# Patient Record
Sex: Female | Born: 1985 | Hispanic: No | Marital: Married | Smoking: Never smoker
Health system: Southern US, Community
[De-identification: ages and names within clinical notes are randomized; demographics above are authoritative.]

## PROBLEM LIST (undated history)

## (undated) ENCOUNTER — Inpatient Hospital Stay (HOSPITAL_COMMUNITY): Payer: Self-pay

---

## 2015-09-25 ENCOUNTER — Other Ambulatory Visit (HOSPITAL_COMMUNITY): Payer: Self-pay | Admitting: Obstetrics and Gynecology

## 2015-09-25 DIAGNOSIS — Z3169 Encounter for other general counseling and advice on procreation: Secondary | ICD-10-CM

## 2015-10-01 ENCOUNTER — Ambulatory Visit (HOSPITAL_COMMUNITY)
Admission: RE | Admit: 2015-10-01 | Discharge: 2015-10-01 | Disposition: A | Discharge status: Home / Self Care | Payer: 59 | Source: Ambulatory Visit | Attending: Obstetrics and Gynecology | Admitting: Obstetrics and Gynecology

## 2015-10-01 ENCOUNTER — Encounter (INDEPENDENT_AMBULATORY_CARE_PROVIDER_SITE_OTHER): Payer: Self-pay

## 2015-10-01 ENCOUNTER — Encounter (HOSPITAL_COMMUNITY): Payer: Self-pay

## 2015-10-01 DIAGNOSIS — N979 Female infertility, unspecified: Secondary | ICD-10-CM | POA: Diagnosis not present

## 2015-10-01 DIAGNOSIS — Z3169 Encounter for other general counseling and advice on procreation: Secondary | ICD-10-CM

## 2015-10-01 MED ORDER — IOPAMIDOL (ISOVUE-300) INJECTION 61%
30.0000 mL | Freq: Once | INTRAVENOUS | Status: AC | PRN
  Administered 2015-10-01: 30 mL

## 2016-01-01 LAB — OB RESULTS CONSOLE GC/CHLAMYDIA
Chlamydia: NEGATIVE
Gonorrhea: NEGATIVE

## 2016-01-01 LAB — OB RESULTS CONSOLE HEPATITIS B SURFACE ANTIGEN: HEP B S AG: NEGATIVE

## 2016-01-01 LAB — OB RESULTS CONSOLE RPR: RPR: NONREACTIVE

## 2016-01-01 LAB — OB RESULTS CONSOLE HIV ANTIBODY (ROUTINE TESTING): HIV: NONREACTIVE

## 2016-01-01 LAB — OB RESULTS CONSOLE RUBELLA ANTIBODY, IGM: Rubella: IMMUNE

## 2016-05-20 ENCOUNTER — Encounter: Payer: 59 | Attending: Obstetrics and Gynecology | Admitting: Registered"

## 2016-05-20 DIAGNOSIS — R7309 Other abnormal glucose: Secondary | ICD-10-CM

## 2016-05-20 DIAGNOSIS — Z713 Dietary counseling and surveillance: Secondary | ICD-10-CM | POA: Diagnosis not present

## 2016-05-20 DIAGNOSIS — O99810 Abnormal glucose complicating pregnancy: Secondary | ICD-10-CM | POA: Insufficient documentation

## 2016-05-20 DIAGNOSIS — Z3A00 Weeks of gestation of pregnancy not specified: Secondary | ICD-10-CM | POA: Diagnosis not present

## 2016-05-20 DIAGNOSIS — Z6823 Body mass index (BMI) 23.0-23.9, adult: Secondary | ICD-10-CM | POA: Insufficient documentation

## 2016-05-21 ENCOUNTER — Encounter: Payer: Self-pay | Admitting: Registered"

## 2016-05-21 NOTE — Progress Notes (Signed)
Patient was seen on 05/20/2016 for Gestational Diabetes self-management . The following learning objectives were met by the patient :   States the definition of Gestational Diabetes  States why dietary management is important in controlling blood glucose  Describes the effects of carbohydrates on blood glucose levels  Demonstrates ability to create a balanced meal plan  Demonstrates carbohydrate counting   States when to check blood glucose levels  Demonstrates proper blood glucose monitoring techniques  States the effect of stress and exercise on blood glucose levels  States the importance of limiting caffeine and abstaining from alcohol and smoking  Plan:  Aim for 3 Carb Choices per meal (45 grams) +/- 1 either way  Aim for 1-2 Carbs per snack Begin reading food labels for Total Carbohydrate of foods Consider  increasing your activity level by walking or other activity daily as tolerated Begin checking BG before breakfast and 2 hours after first bite of breakfast, lunch and dinner as directed by MD  Bring Log Book to every medical appointment   Take medication if directed by MD  Blood glucose monitor given: One Touch Verio Lot # [id number] X Exp: 2016-09-25 Blood glucose reading: 75 mg/dl  Patient instructed to monitor glucose levels: FBS: 60 - <90 2 hour: <120 1 hour: <140 ([place] office)  Patient received the following handouts:  Nutrition Diabetes and Pregnancy  Carbohydrate Counting List  Patient will be seen for follow-up as needed.

## 2016-07-02 LAB — OB RESULTS CONSOLE GBS: GBS: POSITIVE

## 2016-07-20 ENCOUNTER — Encounter (HOSPITAL_COMMUNITY): Payer: Self-pay | Admitting: *Deleted

## 2016-07-20 ENCOUNTER — Inpatient Hospital Stay (HOSPITAL_COMMUNITY)
Admission: AD | Admit: 2016-07-20 | Discharge: 2016-07-24 | DRG: 765 | Disposition: A | Discharge status: Home / Self Care | Payer: 59 | Source: Ambulatory Visit | Attending: Obstetrics and Gynecology | Admitting: Obstetrics and Gynecology

## 2016-07-20 ENCOUNTER — Inpatient Hospital Stay (HOSPITAL_COMMUNITY)
Admission: AD | Admit: 2016-07-20 | Discharge: 2016-07-20 | Disposition: A | Discharge status: Home / Self Care | Payer: 59 | Source: Ambulatory Visit | Attending: Obstetrics and Gynecology | Admitting: Obstetrics and Gynecology

## 2016-07-20 ENCOUNTER — Inpatient Hospital Stay (HOSPITAL_COMMUNITY): Payer: 59 | Admitting: Anesthesiology

## 2016-07-20 ENCOUNTER — Encounter (HOSPITAL_COMMUNITY): Payer: Self-pay

## 2016-07-20 DIAGNOSIS — Z3493 Encounter for supervision of normal pregnancy, unspecified, third trimester: Secondary | ICD-10-CM | POA: Diagnosis present

## 2016-07-20 DIAGNOSIS — O9081 Anemia of the puerperium: Secondary | ICD-10-CM | POA: Diagnosis not present

## 2016-07-20 DIAGNOSIS — O324XX0 Maternal care for high head at term, not applicable or unspecified: Secondary | ICD-10-CM | POA: Diagnosis present

## 2016-07-20 DIAGNOSIS — Z98891 History of uterine scar from previous surgery: Secondary | ICD-10-CM

## 2016-07-20 DIAGNOSIS — O99824 Streptococcus B carrier state complicating childbirth: Secondary | ICD-10-CM | POA: Diagnosis present

## 2016-07-20 DIAGNOSIS — O471 False labor at or after 37 completed weeks of gestation: Secondary | ICD-10-CM | POA: Insufficient documentation

## 2016-07-20 DIAGNOSIS — Z3A00 Weeks of gestation of pregnancy not specified: Secondary | ICD-10-CM

## 2016-07-20 DIAGNOSIS — O24410 Gestational diabetes mellitus in pregnancy, diet controlled: Secondary | ICD-10-CM | POA: Diagnosis present

## 2016-07-20 DIAGNOSIS — Z3A38 38 weeks gestation of pregnancy: Secondary | ICD-10-CM | POA: Diagnosis not present

## 2016-07-20 DIAGNOSIS — D62 Acute posthemorrhagic anemia: Secondary | ICD-10-CM | POA: Diagnosis not present

## 2016-07-20 LAB — GLUCOSE, CAPILLARY
GLUCOSE-CAPILLARY: 133 mg/dL — AB (ref 65–99)
Glucose-Capillary: 107 mg/dL — ABNORMAL HIGH (ref 65–99)
Glucose-Capillary: 125 mg/dL — ABNORMAL HIGH (ref 65–99)
Glucose-Capillary: 86 mg/dL (ref 65–99)

## 2016-07-20 LAB — CBC
HEMATOCRIT: 37.9 % (ref 36.0–46.0)
HEMOGLOBIN: 13.3 g/dL (ref 12.0–15.0)
MCH: 32.7 pg (ref 26.0–34.0)
MCHC: 35.1 g/dL (ref 30.0–36.0)
MCV: 93.1 fL (ref 78.0–100.0)
Platelets: 191 10*3/uL (ref 150–400)
RBC: 4.07 MIL/uL (ref 3.87–5.11)
RDW: 13.1 % (ref 11.5–15.5)
WBC: 11.4 10*3/uL — ABNORMAL HIGH (ref 4.0–10.5)

## 2016-07-20 LAB — ABO/RH: ABO/RH(D): B POS

## 2016-07-20 LAB — TYPE AND SCREEN
ABO/RH(D): B POS
Antibody Screen: NEGATIVE

## 2016-07-20 MED ORDER — PHENYLEPHRINE 40 MCG/ML (10ML) SYRINGE FOR IV PUSH (FOR BLOOD PRESSURE SUPPORT): 80.0000 ug | PREFILLED_SYRINGE | INTRAVENOUS | Status: DC | PRN

## 2016-07-20 MED ORDER — LIDOCAINE HCL (PF) 1 % IJ SOLN
INTRAMUSCULAR | Status: DC | PRN
  Administered 2016-07-20 (×2): 6 mL via EPIDURAL

## 2016-07-20 MED ORDER — SOD CITRATE-CITRIC ACID 500-334 MG/5ML PO SOLN
30.0000 mL | ORAL | Status: DC | PRN
Start: 2016-07-20 — End: 2016-07-21
  Administered 2016-07-20: 30 mL via ORAL
  Filled 2016-07-20: qty 15

## 2016-07-20 MED ORDER — EPHEDRINE 5 MG/ML INJ: 10.0000 mg | INTRAVENOUS | Status: DC | PRN

## 2016-07-20 MED ORDER — DIPHENHYDRAMINE HCL 50 MG/ML IJ SOLN: 12.5000 mg | INTRAMUSCULAR | Status: DC | PRN

## 2016-07-20 MED ORDER — OXYTOCIN 40 UNITS IN LACTATED RINGERS INFUSION - SIMPLE MED
2.5000 [IU]/h | INTRAVENOUS | Status: DC
  Filled 2016-07-20: qty 1000

## 2016-07-20 MED ORDER — PENICILLIN G POTASSIUM 5000000 UNITS IJ SOLR
5.0000 10*6.[IU] | Freq: Once | INTRAMUSCULAR | Status: AC
  Administered 2016-07-20: 5 10*6.[IU] via INTRAVENOUS
  Filled 2016-07-20: qty 5

## 2016-07-20 MED ORDER — TERBUTALINE SULFATE 1 MG/ML IJ SOLN: 0.2500 mg | Freq: Once | INTRAMUSCULAR | Status: DC | PRN

## 2016-07-20 MED ORDER — OXYCODONE-ACETAMINOPHEN 5-325 MG PO TABS: 1.0000 | ORAL_TABLET | ORAL | Status: DC | PRN

## 2016-07-20 MED ORDER — OXYTOCIN 40 UNITS IN LACTATED RINGERS INFUSION - SIMPLE MED
1.0000 m[IU]/min | INTRAVENOUS | Status: DC
  Administered 2016-07-20: 2 m[IU]/min via INTRAVENOUS

## 2016-07-20 MED ORDER — LACTATED RINGERS IV SOLN
INTRAVENOUS | Status: DC
  Administered 2016-07-20: 125 mL via INTRAVENOUS

## 2016-07-20 MED ORDER — LACTATED RINGERS IV SOLN
500.0000 mL | Freq: Once | INTRAVENOUS | Status: AC
  Administered 2016-07-20: 500 mL via INTRAVENOUS

## 2016-07-20 MED ORDER — LIDOCAINE HCL (PF) 1 % IJ SOLN
30.0000 mL | INTRAMUSCULAR | Status: DC | PRN
  Filled 2016-07-20: qty 30

## 2016-07-20 MED ORDER — PHENYLEPHRINE 40 MCG/ML (10ML) SYRINGE FOR IV PUSH (FOR BLOOD PRESSURE SUPPORT)
80.0000 ug | PREFILLED_SYRINGE | INTRAVENOUS | Status: DC | PRN
  Filled 2016-07-20: qty 10

## 2016-07-20 MED ORDER — LACTATED RINGERS IV SOLN: 500.0000 mL | INTRAVENOUS | Status: DC | PRN

## 2016-07-20 MED ORDER — FLEET ENEMA 7-19 GM/118ML RE ENEM: 1.0000 | ENEMA | RECTAL | Status: DC | PRN

## 2016-07-20 MED ORDER — OXYCODONE-ACETAMINOPHEN 5-325 MG PO TABS: 2.0000 | ORAL_TABLET | ORAL | Status: DC | PRN

## 2016-07-20 MED ORDER — OXYTOCIN BOLUS FROM INFUSION: 500.0000 mL | Freq: Once | INTRAVENOUS | Status: DC

## 2016-07-20 MED ORDER — PENICILLIN G POT IN DEXTROSE 60000 UNIT/ML IV SOLN
3.0000 10*6.[IU] | INTRAVENOUS | Status: DC
  Administered 2016-07-20 (×2): 3 10*6.[IU] via INTRAVENOUS
  Filled 2016-07-20 (×6): qty 50

## 2016-07-20 MED ORDER — ONDANSETRON HCL 4 MG/2ML IJ SOLN: 4.0000 mg | Freq: Four times a day (QID) | INTRAMUSCULAR | Status: DC | PRN

## 2016-07-20 MED ORDER — MORPHINE SULFATE (PF) 0.5 MG/ML IJ SOLN
INTRAMUSCULAR | Status: AC
  Filled 2016-07-20: qty 10

## 2016-07-20 MED ORDER — ACETAMINOPHEN 325 MG PO TABS: 650.0000 mg | ORAL_TABLET | ORAL | Status: DC | PRN

## 2016-07-20 MED ORDER — FENTANYL 2.5 MCG/ML BUPIVACAINE 1/10 % EPIDURAL INFUSION (WH - ANES)
14.0000 mL/h | INTRAMUSCULAR | Status: DC | PRN
  Administered 2016-07-20: 12 mL/h via EPIDURAL
  Administered 2016-07-20 (×2): 14 mL/h via EPIDURAL
  Filled 2016-07-20 (×2): qty 100

## 2016-07-20 MED ORDER — LACTATED RINGERS IV SOLN: 500.0000 mL | Freq: Once | INTRAVENOUS | Status: DC

## 2016-07-20 NOTE — Progress Notes (Signed)
Called  By RN due to pushing for over 2 1/2 hrs and vtx not moving  O: VE fully (+2) caput With bony prominence -1  Tracing: baseline 125 (+) variables with ctx  IMP: arrest of descent Class A1 GDM  P) recommend C/S. Not a candidate for vacuum assistance. Pt and husband advised. Risk of  Surgery reviewed including infection, bleeding, poss blood transfusion and its risk, injury to surrounding organs structures,  Internal scar tissue . All ? Answered OR notiified

## 2016-07-20 NOTE — MAU Note (Signed)
Pt presents to MAU c/o ctx that are every 5-288min. Pt states yesterday she noticed a brown discharge. Pt states the ctxs started around 0230 and they were every 30 min then around 0330 they were every 5-448min. Pt states no LOF. Pt said she had not noticed baby moving because of her pain.

## 2016-07-20 NOTE — Anesthesia Preprocedure Evaluation (Addendum)
Anesthesia Evaluation  Patient identified by MRN, date of birth, ID band Patient awake    Reviewed: Allergy & Precautions, NPO status , Patient's Chart, lab work & pertinent test results  History of Anesthesia Complications Negative for: history of anesthetic complications  Airway Mallampati: II  TM Distance: >3 FB Neck ROM: Full    Dental  (+) Dental Advisory Given   Pulmonary neg pulmonary ROS,    breath sounds clear to auscultation       Cardiovascular negative cardio ROS   Rhythm:Regular Rate:Normal     Neuro/Psych negative neurological ROS     GI/Hepatic negative GI ROS, Neg liver ROS,   Endo/Other  diabetes (glu 125), Gestational  Renal/GU negative Renal ROS     Musculoskeletal   Abdominal   Peds  Hematology plt 191k   Anesthesia Other Findings   Reproductive/Obstetrics (+) Pregnancy                            Anesthesia Physical Anesthesia Plan  ASA: II  Anesthesia Plan: Epidural   Post-op Pain Management:    Induction:   PONV Risk Score and Plan:   Airway Management Planned: Natural Airway  Additional Equipment:   Intra-op Plan:   Post-operative Plan:   Informed Consent: I have reviewed the patients History and Physical, chart, labs and discussed the procedure including the risks, benefits and alternatives for the proposed anesthesia with the patient or authorized representative who has indicated his/her understanding and acceptance.   Dental advisory given  Plan Discussed with:   Anesthesia Plan Comments: (Patient identified. Risks/Benefits/Options discussed with patient including but not limited to bleeding, infection, nerve damage, paralysis, failed block, incomplete pain control, headache, blood pressure changes, nausea, vomiting, reactions to medication both or allergic, itching and postpartum back pain. Confirmed with bedside nurse the patient's most recent  platelet count. Confirmed with patient that they are not currently taking any anticoagulation, have any bleeding history or any family history of bleeding disorders. Patient expressed understanding and wished to proceed. All questions were answered. )        Anesthesia Quick Evaluation

## 2016-07-20 NOTE — MAU Note (Signed)
S; comfortable SROM clear fluid  O:  Pitocin VE per RN: 7/80/-1 Tracing: baseline 140 (+) accel Ctx q 2-4 mins  CBG: 133 IMP: Active phase Term Class A1 GDM  P) cont present mgmt. Change to carb modified diet. Exaggerated sims

## 2016-07-20 NOTE — Discharge Instructions (Signed)

## 2016-07-20 NOTE — Anesthesia Pain Management Evaluation Note (Signed)
  CRNA Pain Management Visit Note  Patient: Jacqueline Little, 31 y.o., female  "Hello I am a member of the anesthesia team at [facility]. We have an anesthesia team available at all times to provide care throughout the hospital, including epidural management and anesthesia for C-section. I don't know your plan for the delivery whether it a natural birth, water birth, IV sedation, nitrous supplementation, doula or epidural, but we want to meet your pain goals."   1.Was your pain managed to your expectations on prior hospitalizations?   No prior hospitalizations  2.What is your expectation for pain management during this hospitalization?     Epidural  3.How can we help you reach that goal?   Record the patient's initial score and the patient's pain goal.   Pain: 0  Pain Goal: 5 [facility] wants you to be able to say your pain was always managed very well.  Laban EmperorMalinova,Kieran Nachtigal Hristova 07/20/2016

## 2016-07-20 NOTE — MAU Note (Signed)
S; c/o rectal pressure  O: pitocin  VE 10/100/+1  Tracing: baseline 140 (+) early decel Ctx q  IMP: complete Class a1 GDM GBS cx (+) P) start pushing

## 2016-07-20 NOTE — Anesthesia Procedure Notes (Signed)
Epidural Patient location during procedure: OB Start time: 07/20/2016 12:50 PM End time: 07/20/2016 1:13 PM  Staffing Anesthesiologist: Jairo BenJACKSON, Julianah Marciel Performed: anesthesiologist   Preanesthetic Checklist Completed: patient identified, surgical consent, pre-op evaluation, timeout performed, IV checked, risks and benefits discussed and monitors and equipment checked  Epidural Patient position: sitting Prep: site prepped and draped and DuraPrep Patient monitoring: blood pressure, continuous pulse ox and heart rate Approach: midline Location: L3-L4 Injection technique: LOR air  Needle:  Needle type: Tuohy  Needle gauge: 17 G Needle length: 9 cm Needle insertion depth: 3.5 cm Catheter type: closed end flexible Catheter size: 19 Gauge Catheter at skin depth: 8 cm Test dose: negative (1% lidocaine)  Assessment Events: blood not aspirated, injection not painful, no injection resistance, negative IV test and no paresthesia  Additional Notes Pt identified in Labor room.  Monitors applied. Working IV access confirmed. Sterile prep, drape lumbar spine.  1% lido local L 3,4.  #17ga Touhy LOR air at 3.5 cm L 3,4, cath in easily to 8 cm skin. Test dose OK, cath dosed and infusion begun.  Patient asymptomatic, VSS, no heme aspirated, tolerated well.  Jacqueline Little, MDReason for block:procedure for pain

## 2016-07-20 NOTE — MAU Note (Signed)
Pt sent home a few hours ago. Was 2cm. Ctx more uncomfortable  reports some bloody show. Good fetal movement felt

## 2016-07-20 NOTE — H&P (Signed)
Jacqueline Little is a 31 y.o. female presenting at term gestation in labor. Intact membrane. PNC complicated by  OB History    Gravida Para Term Preterm AB Living   1 0 0 0 0 0   SAB TAB Ectopic Multiple Live Births   0 0 0 0 0     History reviewed. No pertinent past medical history. History reviewed. No pertinent surgical history. Family History: family history includes Cancer in her mother; Diabetes in her paternal grandmother; Hypertension in her paternal grandmother. Social History:  reports that she has never smoked. She has never used smokeless tobacco. She reports that she does not drink alcohol or use drugs.     Maternal Diabetes: Yes:  Diabetes Type:  Diet controlled Genetic Screening: Normal Maternal Ultrasounds/Referrals: Normal Fetal Ultrasounds or other Referrals:  None Maternal Substance Abuse:  No Significant Maternal Medications:  None Significant Maternal Lab Results:  Lab values include: Group B Strep positive Other Comments:  None  Review of Systems  All other systems reviewed and are negative.  History Dilation: 4 Effacement (%): 100 Station: -1 Exam by:: K.Wilson,RN Blood pressure 107/64, pulse 61, temperature 97.8 F (36.6 C), temperature source Oral, resp. rate 20, last menstrual period 09/22/2015. Exam Physical Exam  Constitutional: She is oriented to person, place, and time. She appears well-developed and well-nourished.  HENT:  Head: Atraumatic.  Eyes: EOM are normal.  Neck: Neck supple.  Cardiovascular: Regular rhythm.   Respiratory: Breath sounds normal.  GI: Soft.  Musculoskeletal: She exhibits edema.  Neurological: She is alert and oriented to person, place, and time.  Skin: Skin is warm and dry.  Psychiatric: She has a normal mood and affect.    Prenatal labs: ABO, Rh: --/--/B POS (06/25 1209) Antibody: NEG (06/25 1209) Rubella:  Immune RPR:   NR HBsAg:   neg HIV:   neg GBS: Positive (06/07 0000)   Assessment/Plan: Early  labor Class A1 GDM Term gestation P) admit routine labs. IV Pitocin. Epidural. Defer amniotomy. IV PCN   Sokha Craker A 07/20/2016, 1:23 PM

## 2016-07-21 ENCOUNTER — Encounter (HOSPITAL_COMMUNITY)
Admission: AD | Disposition: A | Discharge status: Home / Self Care | Payer: Self-pay | Source: Ambulatory Visit | Attending: Obstetrics and Gynecology

## 2016-07-21 ENCOUNTER — Encounter (HOSPITAL_COMMUNITY): Payer: Self-pay

## 2016-07-21 DIAGNOSIS — Z98891 History of uterine scar from previous surgery: Secondary | ICD-10-CM

## 2016-07-21 LAB — CBC
HEMATOCRIT: 25.1 % — AB (ref 36.0–46.0)
Hemoglobin: 8.8 g/dL — ABNORMAL LOW (ref 12.0–15.0)
MCH: 33.3 pg (ref 26.0–34.0)
MCHC: 35.1 g/dL (ref 30.0–36.0)
MCV: 95.1 fL (ref 78.0–100.0)
PLATELETS: 156 10*3/uL (ref 150–400)
RBC: 2.64 MIL/uL — ABNORMAL LOW (ref 3.87–5.11)
RDW: 13.3 % (ref 11.5–15.5)
WBC: 11.9 10*3/uL — AB (ref 4.0–10.5)

## 2016-07-21 LAB — RPR: RPR Ser Ql: NONREACTIVE

## 2016-07-21 LAB — GLUCOSE, CAPILLARY: GLUCOSE-CAPILLARY: 137 mg/dL — AB (ref 65–99)

## 2016-07-21 SURGERY — Surgical Case
Anesthesia: Epidural

## 2016-07-21 MED ORDER — SODIUM CHLORIDE 0.9% FLUSH
3.0000 mL | INTRAVENOUS | Status: DC | PRN
Start: 2016-07-21 — End: 2016-07-23

## 2016-07-21 MED ORDER — DIPHENHYDRAMINE HCL 50 MG/ML IJ SOLN
INTRAMUSCULAR | Status: AC
  Filled 2016-07-21: qty 1

## 2016-07-21 MED ORDER — ZOLPIDEM TARTRATE 5 MG PO TABS: 5.0000 mg | ORAL_TABLET | Freq: Every evening | ORAL | Status: DC | PRN

## 2016-07-21 MED ORDER — SCOPOLAMINE 1 MG/3DAYS TD PT72
MEDICATED_PATCH | TRANSDERMAL | Status: AC
  Filled 2016-07-21: qty 1

## 2016-07-21 MED ORDER — HYDROMORPHONE HCL 1 MG/ML IJ SOLN
INTRAMUSCULAR | Status: AC
  Filled 2016-07-21: qty 0.5

## 2016-07-21 MED ORDER — DIBUCAINE 1 % RE OINT: 1.0000 "application " | TOPICAL_OINTMENT | RECTAL | Status: DC | PRN

## 2016-07-21 MED ORDER — OXYTOCIN 40 UNITS IN LACTATED RINGERS INFUSION - SIMPLE MED: 2.5000 [IU]/h | INTRAVENOUS | Status: AC

## 2016-07-21 MED ORDER — KETOROLAC TROMETHAMINE 30 MG/ML IJ SOLN: 30.0000 mg | Freq: Four times a day (QID) | INTRAMUSCULAR | Status: AC | PRN

## 2016-07-21 MED ORDER — NALBUPHINE HCL 10 MG/ML IJ SOLN: 5.0000 mg | Freq: Once | INTRAMUSCULAR | Status: DC | PRN

## 2016-07-21 MED ORDER — OXYCODONE-ACETAMINOPHEN 5-325 MG PO TABS: 1.0000 | ORAL_TABLET | ORAL | Status: DC | PRN

## 2016-07-21 MED ORDER — METOCLOPRAMIDE HCL 5 MG/ML IJ SOLN
INTRAMUSCULAR | Status: AC
  Filled 2016-07-21: qty 2

## 2016-07-21 MED ORDER — PROMETHAZINE HCL 25 MG/ML IJ SOLN: 6.2500 mg | INTRAMUSCULAR | Status: DC | PRN

## 2016-07-21 MED ORDER — LACTATED RINGERS IV SOLN
INTRAVENOUS | Status: DC | PRN
  Administered 2016-07-21 (×2): via INTRAVENOUS

## 2016-07-21 MED ORDER — PHENYLEPHRINE HCL 10 MG/ML IJ SOLN
INTRAMUSCULAR | Status: DC | PRN
  Administered 2016-07-21 (×5): 80 ug via INTRAVENOUS
  Administered 2016-07-21: 40 ug via INTRAVENOUS
  Administered 2016-07-21 (×2): 80 ug via INTRAVENOUS

## 2016-07-21 MED ORDER — OXYTOCIN 10 UNIT/ML IJ SOLN
INTRAMUSCULAR | Status: AC
  Filled 2016-07-21: qty 4

## 2016-07-21 MED ORDER — DEXAMETHASONE SODIUM PHOSPHATE 4 MG/ML IJ SOLN
INTRAMUSCULAR | Status: DC | PRN
  Administered 2016-07-21: 4 mg via INTRAVENOUS

## 2016-07-21 MED ORDER — NALBUPHINE HCL 10 MG/ML IJ SOLN: 5.0000 mg | INTRAMUSCULAR | Status: DC | PRN

## 2016-07-21 MED ORDER — DEXAMETHASONE SODIUM PHOSPHATE 4 MG/ML IJ SOLN
INTRAMUSCULAR | Status: AC
  Filled 2016-07-21: qty 1

## 2016-07-21 MED ORDER — ONDANSETRON HCL 4 MG/2ML IJ SOLN: 4.0000 mg | Freq: Three times a day (TID) | INTRAMUSCULAR | Status: DC | PRN

## 2016-07-21 MED ORDER — MEPERIDINE HCL 25 MG/ML IJ SOLN
INTRAMUSCULAR | Status: AC
  Filled 2016-07-21: qty 1

## 2016-07-21 MED ORDER — NALOXONE HCL 0.4 MG/ML IJ SOLN: 0.4000 mg | INTRAMUSCULAR | Status: DC | PRN

## 2016-07-21 MED ORDER — LACTATED RINGERS IV BOLUS (SEPSIS)
1000.0000 mL | Freq: Once | INTRAVENOUS | Status: AC
  Administered 2016-07-21: 1000 mL via INTRAVENOUS

## 2016-07-21 MED ORDER — DEXTROSE 5 % IV SOLN
1.0000 ug/kg/h | INTRAVENOUS | Status: DC | PRN
  Filled 2016-07-21: qty 2

## 2016-07-21 MED ORDER — SENNOSIDES-DOCUSATE SODIUM 8.6-50 MG PO TABS
2.0000 | ORAL_TABLET | ORAL | Status: DC
  Administered 2016-07-22 – 2016-07-23 (×3): 2 via ORAL
  Filled 2016-07-21 (×4): qty 2

## 2016-07-21 MED ORDER — SIMETHICONE 80 MG PO CHEW
80.0000 mg | CHEWABLE_TABLET | Freq: Three times a day (TID) | ORAL | Status: DC
Start: 2016-07-21 — End: 2016-07-24
  Administered 2016-07-21 – 2016-07-24 (×10): 80 mg via ORAL
  Filled 2016-07-21 (×11): qty 1

## 2016-07-21 MED ORDER — LIDOCAINE HCL (CARDIAC) 20 MG/ML IV SOLN
INTRAVENOUS | Status: AC
  Filled 2016-07-21: qty 5

## 2016-07-21 MED ORDER — MEPERIDINE HCL 25 MG/ML IJ SOLN: 6.2500 mg | INTRAMUSCULAR | Status: DC | PRN

## 2016-07-21 MED ORDER — SCOPOLAMINE 1 MG/3DAYS TD PT72
MEDICATED_PATCH | TRANSDERMAL | Status: DC | PRN
  Administered 2016-07-21: 1 via TRANSDERMAL

## 2016-07-21 MED ORDER — TETANUS-DIPHTH-ACELL PERTUSSIS 5-2.5-18.5 LF-MCG/0.5 IM SUSP: 0.5000 mL | Freq: Once | INTRAMUSCULAR | Status: DC

## 2016-07-21 MED ORDER — SODIUM CHLORIDE 0.9% FLUSH
INTRAVENOUS | Status: AC
  Filled 2016-07-21: qty 3

## 2016-07-21 MED ORDER — MEPERIDINE HCL 25 MG/ML IJ SOLN
INTRAMUSCULAR | Status: DC | PRN
  Administered 2016-07-21 (×2): 12.5 mg via INTRAVENOUS

## 2016-07-21 MED ORDER — DIPHENHYDRAMINE HCL 50 MG/ML IJ SOLN: 12.5000 mg | INTRAMUSCULAR | Status: DC | PRN

## 2016-07-21 MED ORDER — MENTHOL 3 MG MT LOZG: 1.0000 | LOZENGE | OROMUCOSAL | Status: DC | PRN

## 2016-07-21 MED ORDER — CEFAZOLIN SODIUM-DEXTROSE 2-3 GM-% IV SOLR
INTRAVENOUS | Status: DC | PRN
  Administered 2016-07-21: 2 g via INTRAVENOUS

## 2016-07-21 MED ORDER — ERYTHROMYCIN 5 MG/GM OP OINT
TOPICAL_OINTMENT | OPHTHALMIC | Status: AC
  Filled 2016-07-21: qty 1

## 2016-07-21 MED ORDER — DIPHENHYDRAMINE HCL 25 MG PO CAPS: 25.0000 mg | ORAL_CAPSULE | ORAL | Status: DC | PRN

## 2016-07-21 MED ORDER — PRENATAL MULTIVITAMIN CH
1.0000 | ORAL_TABLET | Freq: Every day | ORAL | Status: DC
Start: 2016-07-21 — End: 2016-07-24
  Administered 2016-07-21 – 2016-07-24 (×4): 1 via ORAL
  Filled 2016-07-21 (×4): qty 1

## 2016-07-21 MED ORDER — ONDANSETRON HCL 4 MG/2ML IJ SOLN
INTRAMUSCULAR | Status: DC | PRN
  Administered 2016-07-21: 4 mg via INTRAVENOUS

## 2016-07-21 MED ORDER — WITCH HAZEL-GLYCERIN EX PADS: 1.0000 "application " | MEDICATED_PAD | CUTANEOUS | Status: DC | PRN

## 2016-07-21 MED ORDER — COCONUT OIL OIL
1.0000 "application " | TOPICAL_OIL | Status: DC | PRN
  Administered 2016-07-22: 1 via TOPICAL
  Filled 2016-07-21: qty 120

## 2016-07-21 MED ORDER — DIPHENHYDRAMINE HCL 50 MG/ML IJ SOLN
INTRAMUSCULAR | Status: DC | PRN
  Administered 2016-07-21: 25 mg via INTRAVENOUS

## 2016-07-21 MED ORDER — SIMETHICONE 80 MG PO CHEW
80.0000 mg | CHEWABLE_TABLET | ORAL | Status: DC | PRN
Start: 2016-07-21 — End: 2016-07-24

## 2016-07-21 MED ORDER — EPHEDRINE SULFATE 50 MG/ML IJ SOLN
INTRAMUSCULAR | Status: DC | PRN
  Administered 2016-07-21 (×2): 5 mg via INTRAVENOUS

## 2016-07-21 MED ORDER — SIMETHICONE 80 MG PO CHEW
80.0000 mg | CHEWABLE_TABLET | ORAL | Status: DC
  Administered 2016-07-22 – 2016-07-23 (×3): 80 mg via ORAL
  Filled 2016-07-21 (×3): qty 1

## 2016-07-21 MED ORDER — BUPIVACAINE HCL (PF) 0.25 % IJ SOLN
INTRAMUSCULAR | Status: DC | PRN
  Administered 2016-07-21: 10 mL

## 2016-07-21 MED ORDER — IBUPROFEN 600 MG PO TABS
600.0000 mg | ORAL_TABLET | Freq: Four times a day (QID) | ORAL | Status: DC
  Administered 2016-07-21 – 2016-07-23 (×8): 600 mg via ORAL
  Filled 2016-07-21 (×8): qty 1

## 2016-07-21 MED ORDER — METOCLOPRAMIDE HCL 5 MG/ML IJ SOLN
INTRAMUSCULAR | Status: DC | PRN
  Administered 2016-07-21: 10 mg via INTRAVENOUS

## 2016-07-21 MED ORDER — MORPHINE SULFATE (PF) 0.5 MG/ML IJ SOLN
INTRAMUSCULAR | Status: DC | PRN
  Administered 2016-07-21: 1 mg via EPIDURAL
  Administered 2016-07-21: 4 mg via EPIDURAL

## 2016-07-21 MED ORDER — HYDROMORPHONE HCL 1 MG/ML IJ SOLN
0.2500 mg | INTRAMUSCULAR | Status: DC | PRN
  Administered 2016-07-21: 0.5 mg via INTRAVENOUS

## 2016-07-21 MED ORDER — LACTATED RINGERS IV SOLN
INTRAVENOUS | Status: DC
  Administered 2016-07-21: 09:00:00 via INTRAVENOUS
  Administered 2016-07-21: 125 mL/h via INTRAVENOUS

## 2016-07-21 MED ORDER — ALBUMIN HUMAN 5 % IV SOLN
12.5000 g | Freq: Once | INTRAVENOUS | Status: AC
  Administered 2016-07-21: 12.5 g via INTRAVENOUS
  Filled 2016-07-21: qty 250

## 2016-07-21 MED ORDER — OXYCODONE-ACETAMINOPHEN 5-325 MG PO TABS: 2.0000 | ORAL_TABLET | ORAL | Status: DC | PRN

## 2016-07-21 MED ORDER — DIPHENHYDRAMINE HCL 25 MG PO CAPS: 25.0000 mg | ORAL_CAPSULE | Freq: Four times a day (QID) | ORAL | Status: DC | PRN

## 2016-07-21 MED ORDER — SODIUM CHLORIDE 0.9 % IR SOLN
Status: DC | PRN
  Administered 2016-07-21 (×2): 1

## 2016-07-21 MED ORDER — LIDOCAINE-EPINEPHRINE (PF) 2 %-1:200000 IJ SOLN
INTRAMUSCULAR | Status: DC | PRN
  Administered 2016-07-21: 5 mL via INTRADERMAL

## 2016-07-21 MED ORDER — SCOPOLAMINE 1 MG/3DAYS TD PT72
1.0000 | MEDICATED_PATCH | Freq: Once | TRANSDERMAL | Status: DC
  Filled 2016-07-21: qty 1

## 2016-07-21 MED ORDER — BUPIVACAINE HCL (PF) 0.25 % IJ SOLN
INTRAMUSCULAR | Status: AC
  Filled 2016-07-21: qty 30

## 2016-07-21 MED ORDER — ONDANSETRON HCL 4 MG/2ML IJ SOLN
INTRAMUSCULAR | Status: AC
  Filled 2016-07-21: qty 2

## 2016-07-21 MED ORDER — MAGNESIUM OXIDE 400 (241.3 MG) MG PO TABS
400.0000 mg | ORAL_TABLET | Freq: Every day | ORAL | Status: DC
  Administered 2016-07-22 – 2016-07-24 (×3): 400 mg via ORAL
  Filled 2016-07-21 (×5): qty 1

## 2016-07-21 MED ORDER — POLYSACCHARIDE IRON COMPLEX 150 MG PO CAPS
150.0000 mg | ORAL_CAPSULE | Freq: Two times a day (BID) | ORAL | Status: DC
  Administered 2016-07-21 – 2016-07-24 (×6): 150 mg via ORAL
  Filled 2016-07-21 (×6): qty 1

## 2016-07-21 SURGICAL SUPPLY — 44 items
BARRIER ADHS 3X4 INTERCEED (GAUZE/BANDAGES/DRESSINGS) ×3 IMPLANT
BENZOIN TINCTURE PRP APPL 2/3 (GAUZE/BANDAGES/DRESSINGS) ×3 IMPLANT
CHLORAPREP W/TINT 26ML (MISCELLANEOUS) ×3 IMPLANT
CLAMP CORD UMBIL (MISCELLANEOUS) IMPLANT
CLOSURE WOUND 1/2 X4 (GAUZE/BANDAGES/DRESSINGS) ×1
CLOTH BEACON ORANGE TIMEOUT ST (SAFETY) ×3 IMPLANT
CONTAINER PREFILL 10% NBF 15ML (MISCELLANEOUS) IMPLANT
DRAPE C SECTION CLR SCREEN (DRAPES) ×3 IMPLANT
DRSG OPSITE POSTOP 4X10 (GAUZE/BANDAGES/DRESSINGS) ×3 IMPLANT
ELECT REM PT RETURN 9FT ADLT (ELECTROSURGICAL) ×3
ELECTRODE REM PT RTRN 9FT ADLT (ELECTROSURGICAL) ×1 IMPLANT
EXTRACTOR VACUUM M CUP 4 TUBE (SUCTIONS) IMPLANT
EXTRACTOR VACUUM M CUP 4' TUBE (SUCTIONS)
GLOVE BIOGEL PI IND STRL 7.0 (GLOVE) ×2 IMPLANT
GLOVE BIOGEL PI INDICATOR 7.0 (GLOVE) ×4
GLOVE ECLIPSE 6.5 STRL STRAW (GLOVE) ×3 IMPLANT
GOWN STRL REUS W/TWL LRG LVL3 (GOWN DISPOSABLE) ×6 IMPLANT
KIT ABG SYR 3ML LUER SLIP (SYRINGE) IMPLANT
NEEDLE HYPO 22GX1.5 SAFETY (NEEDLE) ×3 IMPLANT
NEEDLE HYPO 25X5/8 SAFETYGLIDE (NEEDLE) IMPLANT
NS IRRIG 1000ML POUR BTL (IV SOLUTION) ×3 IMPLANT
PACK C SECTION WH (CUSTOM PROCEDURE TRAY) ×3 IMPLANT
PAD OB MATERNITY 4.3X12.25 (PERSONAL CARE ITEMS) ×3 IMPLANT
RTRCTR C-SECT PINK 25CM LRG (MISCELLANEOUS) IMPLANT
STRIP CLOSURE SKIN 1/2X4 (GAUZE/BANDAGES/DRESSINGS) ×2 IMPLANT
SUT CHROMIC GUT AB #0 18 (SUTURE) IMPLANT
SUT MNCRL 0 VIOLET CTX 36 (SUTURE) ×4 IMPLANT
SUT MON AB 2-0 SH 27 (SUTURE)
SUT MON AB 2-0 SH27 (SUTURE) IMPLANT
SUT MON AB 3-0 SH 27 (SUTURE)
SUT MON AB 3-0 SH27 (SUTURE) IMPLANT
SUT MON AB 4-0 PS1 27 (SUTURE) IMPLANT
SUT MONOCRYL 0 CTX 36 (SUTURE) ×8
SUT PLAIN 2 0 (SUTURE)
SUT PLAIN 2 0 XLH (SUTURE) IMPLANT
SUT PLAIN ABS 2-0 CT1 27XMFL (SUTURE) IMPLANT
SUT VIC AB 0 CT1 36 (SUTURE) ×6 IMPLANT
SUT VIC AB 2-0 CT1 27 (SUTURE) ×2
SUT VIC AB 2-0 CT1 TAPERPNT 27 (SUTURE) ×1 IMPLANT
SUT VIC AB 4-0 KS 27 (SUTURE) ×6 IMPLANT
SUT VIC AB 4-0 PS2 27 (SUTURE) IMPLANT
SYR CONTROL 10ML LL (SYRINGE) ×3 IMPLANT
TOWEL OR 17X24 6PK STRL BLUE (TOWEL DISPOSABLE) ×3 IMPLANT
TRAY FOLEY BAG SILVER LF 14FR (SET/KITS/TRAYS/PACK) IMPLANT

## 2016-07-21 NOTE — Lactation Note (Signed)
This note was copied from a baby's chart. Lactation Consultation Note  Patient Name: Jacqueline Little's Date: 07/21/2016 Reason for consult: Initial assessment;Infant < 6lbs Mom called for assist with latch. Assisted with positioning and demonstrating breast compression to help baby of obtain/sustain latch. Mom reports baby popping on/off breast. Nipple have short shaft with aerola edema present. Hand pump given to Mom encouraging pre-pumping to prepare nipple to help baby latch. Encouraged to BF with feed ques, basic teaching reviewed. Lactation brochure left for review, advised of OP services and support group. Encouraged to call for assist as needed.   Maternal Data Has patient been taught Hand Expression?: Yes Does the patient have breastfeeding experience prior to this delivery?: No  Feeding Feeding Type: Breast Fed Length of feed: 10 min  LATCH Score/Interventions Latch: Repeated attempts needed to sustain latch, nipple held in mouth throughout feeding, stimulation needed to elicit sucking reflex. Intervention(s): Adjust position;Assist with latch;Breast massage;Breast compression  Audible Swallowing: A few with stimulation  Type of Nipple: Everted at rest and after stimulation (short nipple shafts bilateral, aerola edema)  Comfort (Breast/Nipple): Soft / non-tender     Hold (Positioning): Assistance needed to correctly position infant at breast and maintain latch. Intervention(s): Breastfeeding basics reviewed;Support Pillows;Position options;Skin to skin  LATCH Score: 7  Lactation Tools Discussed/Used Tools: Pump Breast pump type: Manual WIC Program: No   Consult Status Consult Status: Follow-up Date: 07/22/16 Follow-up type: In-patient    Alfred LevinsGranger, Keiva Dina Ann 07/21/2016, 10:38 AM

## 2016-07-21 NOTE — Anesthesia Postprocedure Evaluation (Signed)
Anesthesia Post Note  Patient: Jacqueline Little  Procedure(s) Performed: Procedure(s) (LRB): CESAREAN SECTION (N/A)     Patient location during evaluation: Mother Baby Anesthesia Type: Epidural Level of consciousness: awake and alert Pain management: pain level controlled Vital Signs Assessment: post-procedure vital signs reviewed and stable Respiratory status: spontaneous breathing, nonlabored ventilation and respiratory function stable Cardiovascular status: stable Postop Assessment: no headache, no backache and epidural receding Anesthetic complications: no    Last Vitals:  Vitals:   07/21/16 0531 07/21/16 0634  BP: (!) 100/45 (!) 94/48  Pulse: 66 63  Resp: 18 18  Temp: 37.2 C 37.1 C    Last Pain:  Vitals:   07/21/16 0634  TempSrc: Oral  PainSc:    Pain Goal: Patients Stated Pain Goal: 2 (07/20/16 1930)               Junious SilkGILBERT,Catie Chiao

## 2016-07-21 NOTE — Op Note (Signed)
NAME:  Jacqueline Little, Jacqueline Little                      ACCOUNT NO.:  MEDICAL RECORD NO.:  [id number]  LOCATION:                                 FACILITY:  PHYSICIAN:  Maxie Better, M.D.    DATE OF BIRTH:  DATE OF PROCEDURE:  07/21/2016 DATE OF DISCHARGE:                              OPERATIVE REPORT   PREOPERATIVE DIAGNOSES: 1. Arrest of descent. 2. Class A1 gestational diabetes. 3. Term gestation.  PROCEDURE:  Primary cesarean section, Kerr hysterotomy.  POSTOPERATIVE DIAGNOSES: 1. Arrest of descent. 2. Class A1 gestational diabetes. 3. Term gestation.  ANESTHESIA:  Epidural.  SURGEON:  Maxie Better, M.D.  ASSISTANT:  Carlean Jews.  DESCRIPTION OF PROCEDURE:  Under adequate epidural anesthesia, the patient was placed in the supine position with a left lateral tilt.  She was sterilely prepped and draped in the usual fashion.  An indwelling Foley catheter was already in place draining blood-tinged urine.  The patient was sterilely prepped and draped in the usual fashion.  0.25% Marcaine was injected along the planned Pfannenstiel skin incision site. Pfannenstiel skin incision was then made and carried down to the rectus fascia.  The rectus fascia was opened transversely.  Rectus fascia was then bluntly and sharply dissected off the rectus muscle in a superior and inferior fashion.  The rectus muscle was split in the midline.  The parietal peritoneum was entered bluntly and extended.  An Alexis self- retaining retractor was then placed.  The vesicouterine peritoneum was opened transversely.  The bladder was displaced inferiorly with blunt dissection.  A curvilinear low transverse uterine incision was then made and extended with bandage scissors.  Subsequent delivery of a live female from the left occiput posterior position was accomplished.  The baby was bulb suctioned on the abdomen.  The cord was clamped and cut. The baby was transferred to the awaiting pediatricians,  who assigned Apgars of 9 and 9 at one and five minutes respectively.  The placenta was manually removed.  Uterine cavity was cleaned of debris.  Uterine incision had a small extension on the left inferior aspect, about an inch long.  That extension was closed separately using 0 Monocryl running locked sutures.  The remaining incision was then closed with 0 Monocryl suture in a running locked stitch followed by imbrication stitch using 0 Monocryl as well.  Good hemostasis was noted.  Normal tubes and ovaries were noted bilaterally.  The abdomen was irrigated and suctioned of debris.  Interceed was placed in the lower uterine segment overlying the incision in an inverted T fashion.  The Alexis retractor was then removed.  The parietal peritoneum was then closed with 2-0 Vicryl.  The rectus fascia was closed with 0 Vicryl x2.  The subcutaneous area was irrigated, small bleeders cauterized, interrupted 2-0 plain sutures placed, and the skin approximated with 4-0 Vicryl subcuticular closure.  Steri-Strips and benzoin were then placed.  SPECIMENS:  Placenta, not sent to Pathology.  ESTIMATED BLOOD LOSS:  1 L.  URINE OUTPUT:  100 cc.  INTRAOPERATIVE FLUID:  2500 cc.  SPONGE AND INSTRUMENT COUNT:  Counts x2 were correct.  COMPLICATIONS:  None.  The patient tolerated  the procedure well and was transferred to the recovery room in stable condition.     Maxie BetterSheronette Cleon Thoma, M.D.     [place]/MEDQ  D:  07/21/2016  T:  07/21/2016  Job:  [id number]

## 2016-07-21 NOTE — Progress Notes (Signed)
POSTOPERATIVE DAY # 0 S/P Primary LTCS for arrest of descent, baby girl    S:         Reports feeling okay, reports some incisional pain with movement, but none at rest  Denies CP, SOB, dizziness              Tolerating po intake / no nausea / no vomiting / no flatus / no BM             Bleeding is light             Pain controlled with Motrin             Up ad lib / ambulatory/ voiding QS  Newborn breast feeding - reports difficulty with latching and positioning    O:  VS: BP (!) 99/54   Pulse 73   Temp 98.4 F (36.9 C) (Oral)   Resp 18   Ht 5\' 5"  (1.651 m)   Wt 70.3 kg (155 lb)   LMP 09/22/2015 (Exact Date)   SpO2 98%   Breastfeeding? Unknown   BMI 25.79 kg/m   Orthostatic VS reviewed: slightly elevated pulse with standing, BPs stable  LABS:               Recent Labs  07/20/16 1209 07/21/16 1215  WBC 11.4* 11.9*  HGB 13.3 8.8*  PLT 191 156               Bloodtype: --/--/B POS (06/25 1215)  Rubella: Immune (12/06 0000)                                             I&O: Intake/Output      06/25 0701 - 06/26 0700 06/26 0701 - 06/27 0700   P.O.  480   I.V. (mL/kg) 2500 (35.6) 525 (7.5)   Total Intake(mL/kg) 2500 (35.6) 1005 (14.3)   Urine (mL/kg/hr) 3675 1000 (2)   Blood 1000    Total Output 4675 1000   Net -2175 +5                     Physical Exam:             Alert and Oriented X3  Lungs: Clear and unlabored  Heart: regular rate and rhythm / no mumurs  Abdomen: soft, non-tender, non-distended, active bowel sounds in all quadrants             Fundus: firm, non-tender, U -E             Dressing: honeycomb with steri-strips c/d/i              Incision:  approximated with sutures / no erythema / no ecchymosis / no drainage  Perineum: intact  Lochia: appropriate, no clots  Extremities: no edema, no calf pain or tenderness, SCDs on   A:        POD # 1 S/P Primary LTCS for arrest of descent              ABL Anemia   Class A1GDM - stable, delivered   P:         Routine postoperative care              Continue to watch orthostatic vital signs and notify if she becomes symptomatic   Niferex 150mg  BID  Mag oxide 400mg  daily  May D/C foley and 1 IV site today  See lactation today for breastfeeding support   Repeat CBC in AM  Continue current care   Carlean JewsMeredith Sigmon, MSN, CNM [facility]

## 2016-07-21 NOTE — Anesthesia Postprocedure Evaluation (Signed)
Anesthesia Post Note  Patient: Nehemiah MassedSneha Lecuyer  Procedure(s) Performed: Procedure(s) (LRB): CESAREAN SECTION (N/A)     Patient location during evaluation: PACU Anesthesia Type: Epidural Level of consciousness: awake and alert Pain management: pain level controlled Vital Signs Assessment: post-procedure vital signs reviewed and stable Respiratory status: spontaneous breathing and respiratory function stable Cardiovascular status: blood pressure returned to baseline and stable Postop Assessment: spinal receding Anesthetic complications: no    Last Vitals:  Vitals:   07/21/16 0531 07/21/16 0634  BP: (!) 100/45 (!) 94/48  Pulse: 66 63  Resp: 18 18  Temp: 37.2 C 37.1 C    Last Pain:  Vitals:   07/21/16 0634  TempSrc: Oral  PainSc:    Pain Goal: Patients Stated Pain Goal: 2 (07/20/16 1930)               Heather RobertsSINGER,Ayliana Casciano DANIEL

## 2016-07-21 NOTE — Transfer of Care (Signed)
Immediate Anesthesia Transfer of Care Note  Patient: Jacqueline Little  Procedure(s) Performed: Procedure(s): CESAREAN SECTION (N/A)  Patient Location: PACU  Anesthesia Type:Epidural  Level of Consciousness: awake  Airway & Oxygen Therapy: Patient Spontanous Breathing  Post-op Assessment: Report given to RN and Post -op Vital signs reviewed and stable  Post vital signs: Reviewed and stable  Last Vitals:  Vitals:   07/20/16 2331 07/20/16 2356  BP: (!) 103/49 98/61  Pulse: (!) 54 70  Resp:  18  Temp: 37.1 C     Last Pain:  Vitals:   07/20/16 2331  TempSrc: Oral  PainSc:       Patients Stated Pain Goal: 2 (07/20/16 1930)  Complications: No apparent anesthesia complications

## 2016-07-21 NOTE — Brief Op Note (Signed)
07/20/2016 - 07/21/2016  1:28 AM  PATIENT:  Nehemiah MassedSneha Sciandra  31 y.o. female  PRE-OPERATIVE DIAGNOSIS:  Arrest of Descent, Class A1 GDM, Term gestation  POST-OPERATIVE DIAGNOSIS:  Arrest of Descent, Class A1 GDM, term gestation  PROCEDURE:  Primary Cesarean section, kerr hysterotomy  SURGEON:  Surgeon(s) and Role:    * Maxie Betterousins, Tahnee Cifuentes, MD - Primary  PHYSICIAN ASSISTANT:   ASSISTANTS: Carlean JewsMeredith Sigmon, CNM   ANESTHESIA:   epidural Findings:  Live female LOP, ant placenta, nl ovaries and tubes EBL:  Total I/O In: 2500 [I.V.:2500] Out: 1100 [Urine:100; Blood:1000]  BLOOD ADMINISTERED:none  DRAINS: none   LOCAL MEDICATIONS USED:  MARCAINE     SPECIMEN:  No Specimen  DISPOSITION OF SPECIMEN:  N/A  COUNTS:  YES  TOURNIQUET:  * No tourniquets in log *  DICTATION: .Other Dictation: Dictation Number [id number]  PLAN OF CARE: Admit to inpatient   PATIENT DISPOSITION:  PACU - hemodynamically stable.   Delay start of Pharmacological VTE agent (>24hrs) due to surgical blood loss or risk of bleeding: no

## 2016-07-21 NOTE — Addendum Note (Signed)
Addendum  created 07/21/16 0747 by Junious SilkGilbert, Trenton Passow, CRNA   Sign clinical note

## 2016-07-22 LAB — CBC
HCT: 22.8 % — ABNORMAL LOW (ref 36.0–46.0)
Hemoglobin: 7.8 g/dL — ABNORMAL LOW (ref 12.0–15.0)
MCH: 32.9 pg (ref 26.0–34.0)
MCHC: 34.2 g/dL (ref 30.0–36.0)
MCV: 96.2 fL (ref 78.0–100.0)
PLATELETS: 143 10*3/uL — AB (ref 150–400)
RBC: 2.37 MIL/uL — ABNORMAL LOW (ref 3.87–5.11)
RDW: 13.5 % (ref 11.5–15.5)
WBC: 9.1 10*3/uL (ref 4.0–10.5)

## 2016-07-22 NOTE — Lactation Note (Signed)
This note was copied from a baby's chart. Lactation Consultation Note  Patient Name: Jacqueline Little  Pecola LeisureBaby is 39 hours old  6% weight loss, less than 6 pounds, Serum Bili elevated -  Due to mom having sore nipples bilaterally, few swallows to start - and increased with let down, And mom having intermittent discomfort with feeding due to her pulling breast tissue away from  The baby's mouth, also Serum Bilirubin being elevated - LC asked the [facility] RN to set up the DEBP.  LC was able to hand express several drops of colostrum. LC had mom apply to nipples.  @ consult baby awake after MBU RN assessed baby , baby cueing.  LC assisted mom with the cross cradle- and depth , dimple in the cheek noted,( may be natural , dad has  Cheek dimples ). When depth improved dimpling faint, swallows increased with length of feeding.  Mom had mentioned she had been feeding in the cradle position ( LC suspects that has been contributing to  The positional strips and the chewing with latch mom is [facility] ). LC also noted the mom pulling  The breast tissue away from the babies mouth. LC reviewed with mom the proper way to do breast  Compressions and not to pull breast tissue away from the baby's mouth.  LC encouraged mom to drink plenty of fluids and increase calories.    LC plan - Breast shells between feedings / alternating with comfort gels ( LC instructed mom )   breast massage, hand express, pre - pump to make the nipple and areola more elastic for a deeper latch  After offering both breast / and baby is settled, post pump with DEBP . Save milk for the next feeding and to be supplemented back to baby.    Maternal Data    Feeding Feeding Type: Breast Fed Length of feed: 10 min ( LC assisted to work on depth, swallows noted )  LATCH Score/Interventions Latch: Grasps breast easily, tongue down, lips flanged, rhythmical sucking. Intervention(s): Adjust position;Assist  with latch  Audible Swallowing: A few with stimulation (increased to 2 within the 10 minutes )  Type of Nipple: Everted at rest and after stimulation  Comfort (Breast/Nipple): Filling, red/small blisters or bruises, mild/mod discomfort  Problem noted: Mild/Moderate discomfort (positional strips on both nipples ) Interventions (Mild/moderate discomfort): Hand massage;Hand expression;Reverse pressue;Pre-pump if needed  Hold (Positioning): Assistance needed to correctly position infant at breast and maintain latch.  LATCH Score: 7  Lactation Tools Discussed/Used     Consult Status      Jacqueline Little Little, 4:03 PM

## 2016-07-22 NOTE — Progress Notes (Signed)
POSTOPERATIVE DAY # 1 S/P Primary C/S for arrest of descent    S:         Reports feeling better today, up and moving around room, wants to get in the shower and ambulate in halls              Tolerating po intake / no nausea / no vomiting / + flatus / no BM             Bleeding is light             Pain controlled with ibuprofen (OTC)             Up ad lib / ambulatory/ voiding QS  Newborn breast feeding- lactation consulted yesterday    O:  VS: BP (!) 85/48 (BP Location: Right Arm) Comment: asymptomatic  Pulse 65   Temp 98.1 F (36.7 C) (Oral)   Resp 18   Ht 5\' 5"  (1.651 m)   Wt 70.3 kg (155 lb)   LMP 09/22/2015 (Exact Date)   SpO2 99%   Breastfeeding? Unknown   BMI 25.79 kg/m    LABS:               Recent Labs  07/21/16 1215 07/22/16 0520  WBC 11.9* 9.1  HGB 8.8* 7.8*  PLT 156 143*               Bloodtype: --/--/B POS (06/25 1215)  Rubella: Immune (12/06 0000)                                             I&O: Intake/Output      06/26 0701 - 06/27 0700 06/27 0701 - 06/28 0700   P.O. 480    I.V. (mL/kg) 525 (7.5)    Total Intake(mL/kg) 1005 (14.3)    Urine (mL/kg/hr) 3200 (1.9)    Blood     Total Output 3200     Net -2195                       Physical Exam:             Alert and Oriented X3  Lungs: Clear and unlabored  Heart: regular rate and rhythm / no mumurs  Abdomen: soft, non-tender, non-distended              Fundus: firm, non-tender, U/1             Dressing honeycomb in place, clean dry and intact               Incision:  approximated with sutures / no erythema / no ecchymosis / no drainage  Perineum: intact   Lochia: light  Extremities: no Homans sign, no edema, no calf pain or tenderness  A:        POD # 2 S/P primary C/S for arrest of descent             GDM A1   Mild ABL anemia- asymptomatic   P:        Routine postoperative care              Possible DC tomorrow   niferex and magnesium supplementation prior to DC     Jacqueline Little  BSN, SNM  07/22/2016, 10:54 AM

## 2016-07-23 MED ORDER — IBUPROFEN 800 MG PO TABS
800.0000 mg | ORAL_TABLET | Freq: Four times a day (QID) | ORAL | Status: DC
  Administered 2016-07-23 – 2016-07-24 (×5): 800 mg via ORAL
  Filled 2016-07-23 (×5): qty 1

## 2016-07-23 NOTE — Lactation Note (Signed)
This note was copied from a baby's chart. Lactation Consultation Note  Patient Name: Jacqueline Little ZOXWR'U Date: 07/23/2016 Reason for consult: Follow-up assessment;Breast/nipple pain;Difficult latch    Follow up consultation at request of mom. Infant with 9 Bf for 15-45 minutes, 3 voids and 3 stool in last 24 hours. Infant weight 5 lb 9.2 oz with 6.5% weight loss since birth.   Mom was attempting to feed infant when LC arrived. Mom c/o pain with latch throughout feeding. Mom with firm breast, edematous areola and everted nipples. Nipples with positional stripes to both breasts with right > left. Left nipple less severe but with more pain. # 20 NS was applied and infant was relatched to breast, mom reports pain did not improve. We then applied # 24 NS and mom still reports pain did not improve but infant did stay at the breast for longer periods of time. Infant noted to be chomping and biting with initial latch and on gloved finger. She is small and has a small oral cavity. Infant was on and off the breast initially and then would settle in and nurse more rhythmically for longer periods of time. She was noted to have increased swallows with breast compression and with continuation of feeding. She fed on and off with for 30-45  Minutes using # 24 NS.   Reviewed risks of using NS and importance of protecting milk supply with use. Reviewed application and cleaning of NS. Mom able to apply NS independently. Comfort gels given with instructions for use and cleaning, advised mom not to use coconut oil with comfort gels. Reviewed assembling, disassembling and cleaning of pump parts. Reviewed pumping on Initiate setting and BM storage.   Colostrum was hand expressed (2 ml) and pumped (7 ml) and Parents were shown how to feed infant via spoon and curved tip syringe. Infant took 9 cc EBM via syringe (8 ml) and spoon (1 ml). Mom is able to hand express independently.  Discussed with mom that infant needs  increased calories. Mom has a DEBP in the room, she is not pumping regularly. Advised mom that I would strongly recommend pumping every 2-3 hours post BF for 15 minutes and supplement infant with all EBM after BF. Discussed with mom that if mom is not able to pump or hand express supplement for infant and if weight is down again tonight that formula may need to be started. Mom reports she does not want infant to give formula.   Parents without further questions/concerns at this time. Report to Pleas Koch, RN.   Plan was made and written on board for parents to follow:  Breast feed infant at breast 8-12 x in 24 hours at first feeding cues with no longer than 3 hours between feeds, use # 24 NS as needed Supplement infant with all EBM available via spoon or curved tip syringe Pump for 15 minutes with DEBP on Initiate setting Hand express post pumping Rest between feeds Call for assistance as needed Follow up tomorrow and prn.     Maternal Data Formula Feeding for Exclusion: No Has patient been taught Hand Expression?: Yes Does the patient have breastfeeding experience prior to this delivery?: No  Feeding Feeding Type: Breast Fed Length of feed: 30 min  LATCH Score/Interventions Latch: Repeated attempts needed to sustain latch, nipple held in mouth throughout feeding, stimulation needed to elicit sucking reflex. Intervention(s): Adjust position;Assist with latch;Breast massage;Breast compression  Audible Swallowing: A few with stimulation Intervention(s): Alternate breast massage;Hand expression;Skin to skin  Type of Nipple: Everted at rest and after stimulation  Comfort (Breast/Nipple): Filling, red/small blisters or bruises, mild/mod discomfort  Problem noted: Cracked, bleeding, blisters, bruises;Mild/Moderate discomfort Interventions  (Cracked/bleeding/bruising/blister): Hand pump;Expressed breast milk to nipple;Double electric pump Interventions (Mild/moderate discomfort):  Comfort gels  Hold (Positioning): Assistance needed to correctly position infant at breast and maintain latch. Intervention(s): Breastfeeding basics reviewed;Support Pillows;Position options;Skin to skin  LATCH Score: 6  Lactation Tools Discussed/Used Tools: Comfort gels;Nipple Shields Nipple shield size: 20 Breast pump type: Double-Electric Breast Pump WIC Program: No Pump Review: Setup, frequency, and cleaning;Milk Storage Initiated by:: Reviewed and encouraged every 3 hours   Consult Status Consult Status: Follow-up Date: 07/24/16 Follow-up type: In-patient    Jacqueline Little 07/23/2016, 7:13 PM

## 2016-07-23 NOTE — Lactation Note (Signed)
This note was copied from a baby's chart. Lactation Consultation Note  Patient Name: Jacqueline Little ZOXWR'UToday's Date: 07/23/2016  Mom states baby is only latching to the nipple and she feels pain throughout the feeding.  She states she has used different positions.  Right nipple is cracked at the base and bleeding.  Left nipple red.  We discussed a nipple shield as a possible tool to open baby's mouth and act as a barrier. Mom will call out for next feeding for assessment and possible shield.   Maternal Data    Feeding    LATCH Score/Interventions                      Lactation Tools Discussed/Used     Consult Status      Huston FoleyMOULDEN, Eunice Oldaker S 07/23/2016, 2:25 PM

## 2016-07-23 NOTE — Progress Notes (Signed)
POSTOPERATIVE DAY # 2 S/P primary C/S arrest of descent    S:         Reports feeling okay, tired with some pain with moving, "does not like to move because of pain"             Tolerating po intake / no nausea / no vomiting / + flatus / no BM             Bleeding is light             Pain controlled with ibuprofen (OTC) and percocet             Up ad lib / ambulatory/ voiding QS  Newborn breast feeding, no complaints or concerns    O:  VS: BP (!) 90/49 (BP Location: Right Arm)   Pulse 68   Temp 98 F (36.7 C) (Oral)   Resp 18   Ht 5\' 5"  (1.651 m)   Wt 70.3 kg (155 lb)   LMP 09/22/2015 (Exact Date)   SpO2 99%   Breastfeeding? Unknown   BMI 25.79 kg/m    LABS:               Recent Labs  07/21/16 1215 07/22/16 0520  WBC 11.9* 9.1  HGB 8.8* 7.8*  PLT 156 143*               Bloodtype: --/--/B POS (06/25 1215)  Rubella: Immune (12/06 0000)                                           Physical Exam:             Alert and Oriented X3  Abdomen: soft, non-tender, non-distended              Fundus: firm, non-tender, U/2             Dressing honeycomb, clean, dry and intact               Incision:  approximated with no / no erythema / no ecchymosis / no drainage  Perineum: intact  Lochia: light  Extremities: no Homans sign, no edema, no calf pain or tenderness  A:        POD # 2 S/P Primary C/S arrest of descent             Doing well- stable status   Mild ABL anemia- asymptomatic   P:        Routine postoperative care              DC home tomorrow   Iron supplementation and magnesium prior to DC home      Steward DroneVeronica Heyli Min BSN, SNM  07/23/2016, 10:12 AM

## 2016-07-24 MED ORDER — IBUPROFEN 800 MG PO TABS: 800.0000 mg | ORAL_TABLET | Freq: Four times a day (QID) | ORAL | 0 refills | Status: AC

## 2016-07-24 MED ORDER — ACETAMINOPHEN 500 MG PO TABS: 500.0000 mg | ORAL_TABLET | Freq: Four times a day (QID) | ORAL | 0 refills | Status: AC | PRN

## 2016-07-24 MED ORDER — POLYSACCHARIDE IRON COMPLEX 150 MG PO CAPS: 150.0000 mg | ORAL_CAPSULE | Freq: Two times a day (BID) | ORAL | 0 refills | Status: AC

## 2016-07-24 MED ORDER — MAGNESIUM OXIDE 400 (241.3 MG) MG PO TABS: 400.0000 mg | ORAL_TABLET | Freq: Every day | ORAL | 0 refills | Status: AC

## 2016-07-24 NOTE — Discharge Instructions (Addendum)
Remove honeycomb dressing on day 6 of surgery

## 2016-07-24 NOTE — Progress Notes (Signed)
Patient ID: Jacqueline Little, female   DOB: [birth year], 31 y.o.   MRN: [id number]   POSTOPERATIVE DAY # 3 S/P primary C/S arrest of descent  G1P1, GIRL 12.30 am on 6/26, 5'15"   S:    feels better, is ambulating and pain well controlled        O:    BP (!) 95/51 (BP Location: Right Arm)   Pulse 69   Temp 98.4 F (36.9 C) (Oral)   Resp 18   Ht 5\' 5"  (1.651 m)   Wt 155 lb (70.3 kg)   LMP 09/22/2015 (Exact Date)   SpO2 99%   Breastfeeding? Unknown   BMI 25.79 kg/m             CBC Latest Ref Rng & Units 07/22/2016 07/21/2016 07/20/2016  WBC 4.0 - 10.5 K/uL 9.1 11.9(H) 11.4(H)  Hemoglobin 12.0 - 15.0 g/dL 7.8(L) 8.8(L) 13.3  Hematocrit 36.0 - 46.0 % 22.8(L) 25.1(L) 37.9  Platelets 150 - 400 K/uL 143(L) 156 191                 Bloodtype: --/--/B POS (06/25 1215)             Rubella: Immune (12/06 0000)                                           Physical Exam:             Alert and Oriented X3             Abdomen: soft, non-tender, non-distended              Fundus: firm, non-tender, U/2             Dressing honeycomb, clean, dry and intact               Incision:  approximated with no / no erythema / no ecchymosis / no drainage             Perineum: intact             Lochia: light             Extremities: no Homans sign, no edema, no calf pain or tenderness  A:        POD # 3 S/P Primary C/S arrest of descent             Doing well- stable status              Mild ABL anemia- asymptomatic             A1GDM  P:        Routine postoperative care              DC home             Iron supplementation  2hr GTT after 6wks PP

## 2016-07-24 NOTE — Discharge Summary (Signed)
Obstetric Discharge Summary Reason for Admission: onset of labor  A1gdm Prenatal Procedures: NST and ultrasound Intrapartum Procedures: cesarean: low cervical, transverse Postpartum Procedures: none Complications-Operative and Postpartum: none acute blood anemia  Hemoglobin  Date Value Ref Range Status  07/22/2016 7.8 (L) 12.0 - 15.0 g/dL Final   HCT  Date Value Ref Range Status  07/22/2016 22.8 (L) 36.0 - 46.0 % Final    Physical Exam:  General: alert and cooperative Lochia: appropriate Uterine Fundus: firm Incision: healing well, no significant drainage DVT Evaluation: No evidence of DVT seen on physical exam.  Discharge Diagnoses: Term Pregnancy-delivered  Discharge Information: Date: 07/24/2016 Activity: pelvic rest Diet: routine Medications: PNV, Ibuprofen, Iron and magnesium supplement. Tylenol alternating with Ibuprofen for pain  Condition: stable Instructions: refer to practice specific booklet Discharge to: home Follow-up Information    Maxie Betterousins, Sheronette, MD Follow up in 6 week(s).   Specialty:  Obstetrics and Gynecology Contact information: [address] [place] [id number] [phone]           Newborn Data: Live born female  Birth Weight: 5 lb 15.4 oz (2705 g) APGAR: 9, 9  Home with mother.  Dorotea Hand R 07/24/2016, 12:05 PM

## 2016-07-24 NOTE — Lactation Note (Signed)
This note was copied from a baby's chart. Lactation Consultation Note  Patient Name: Jacqueline Little ZOXWR'UToday's Date: 07/24/2016 Reason for consult: Follow-up assessment;Infant weight loss;Breast/nipple pain  AS LC walked in the room , mom pumping both breast, milk in bilaterally .  LC assessed breast tissue and noted the nipples to be healthier in appearance  Compared to two days ago.  Baby awake and hungry after MD exam. Attempted latching without NS, on an off pattern,  Applied #24 NS and baby still pulling back from breast.  LC used a 569F feeding tube inserted in the side of the mouth and the baby fed for 15 mins  Ended up taking 22 ml of EBM from SNS. And was satisfied.  LC reviewed LC plan , including a 569F SNS , LC recommended Feed with 569F SNS ( as shown ) and latch with depth. ( supplement 24 ml ).  And post pump after each feeding 10 -15 mins.  LC stressed the importance of feeding at least 8 times a day and since the baby is less than 6 pounds.  Sore nipple and engorgement prevention and tx reviewed with mom.  LC stressed the importance of wearing the breast shells between feedings, ( as suggested 2 days ago  And they have been sitting on the counter) and alternate with comfort gels.  Mom still has some areola edema a the base and the shells would help. Per mom has her own DEBP at home.  LC offered an LC O/P appt. And mom requested to call back once she is aware of her other appt. Mother informed of post-discharge support and given phone number to the lactation department, including services for phone call assistance; out-patient appointments; and breastfeeding support group. List of other breastfeeding resources in the community given in the handout. Encouraged mother to call for problems or concerns related to breastfeeding.     Maternal Data Has patient been taught Hand Expression?: Yes  Feeding Feeding Type: Breast Milk Length of feed: 15 min  LATCH Score/Interventions (  LC assessed this latch )  Latch: Grasps breast easily, tongue down, lips flanged, rhythmical sucking. Intervention(s): Adjust position;Assist with latch;Breast massage;Breast compression  Audible Swallowing: Spontaneous and intermittent  Type of Nipple: Everted at rest and after stimulation  Comfort (Breast/Nipple): Filling, red/small blisters or bruises, mild/mod discomfort  Problem noted:  (per mom no discomfort )  Hold (Positioning): Assistance needed to correctly position infant at breast and maintain latch. Intervention(s): Breastfeeding basics reviewed;Support Pillows;Position options;Skin to skin  LATCH Score: 8  Lactation Tools Discussed/Used Tools: 569F feeding tube / Syringe Nipple shield size: 24 (instilled 4 ml in the top of the NS ) Breast pump type: Double-Electric Breast Pump (mom recently pumped off 20 ml )   Consult Status Consult Status: Complete (LC offered mom , mom requested to call [facility] when aware of other appt.s ) Date: 07/24/16 Follow-up type: In-patient    Matilde SprangMargaret Ann Trayvion Embleton 07/24/2016, 10:29 AM

## 2018-03-14 IMAGING — RF DG HYSTEROGRAM
3 series · 3 of 3 positions shown · IV contrast (omnipaque)
Comparison: None.

CLINICAL DATA: Infertility

EXAM:
HYSTEROSALPINGOGRAM
TECHNIQUE: Following cleansing of the cervix and vagina with Betadine solution,
a hysterosalpingogram was performed using a 5-French
hysterosalpingogram catheter and Omnipaque 300 contrast. The patient
tolerated the examination without difficulty.

[Series 1: run · 1 of 1 slices shown (1 of 3)]
[im 1/1]
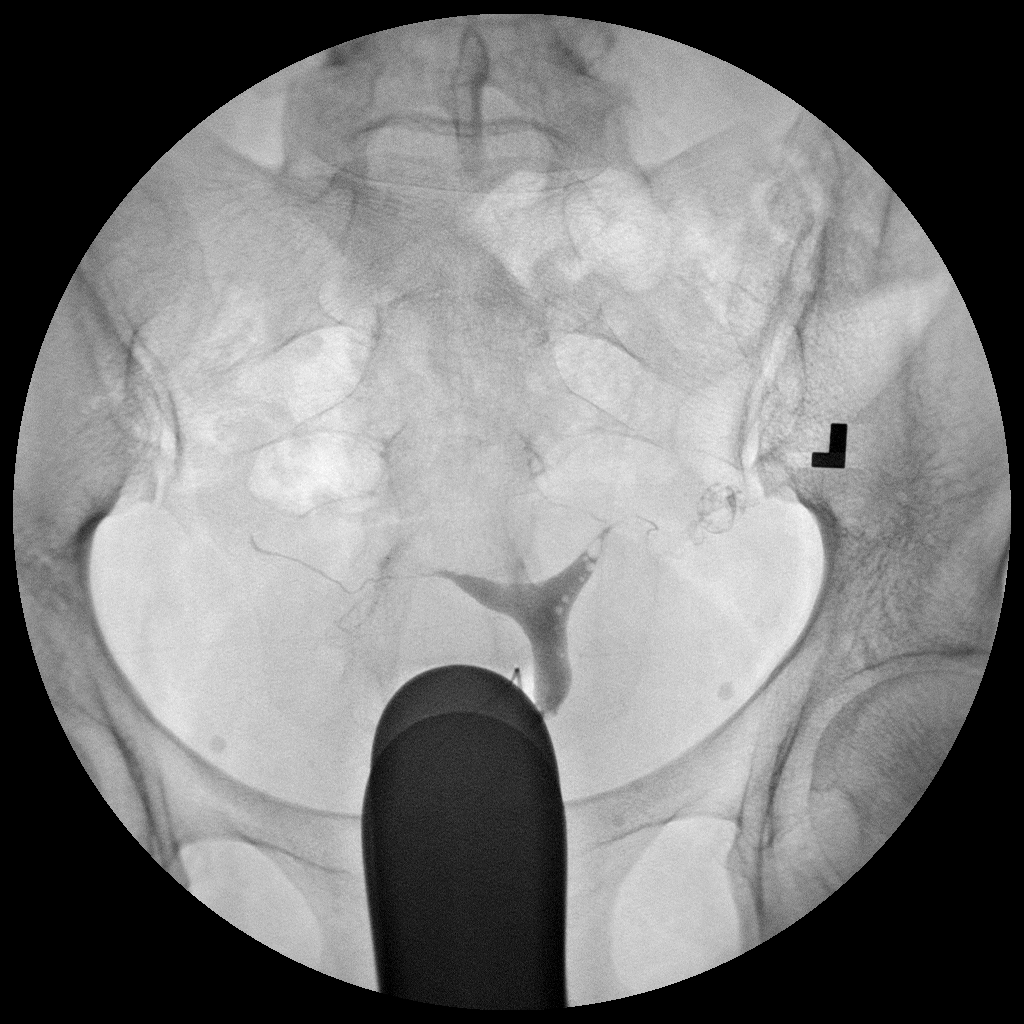

[Series 2: run · 1 of 1 slices shown (2 of 3)]
[im 1/1]
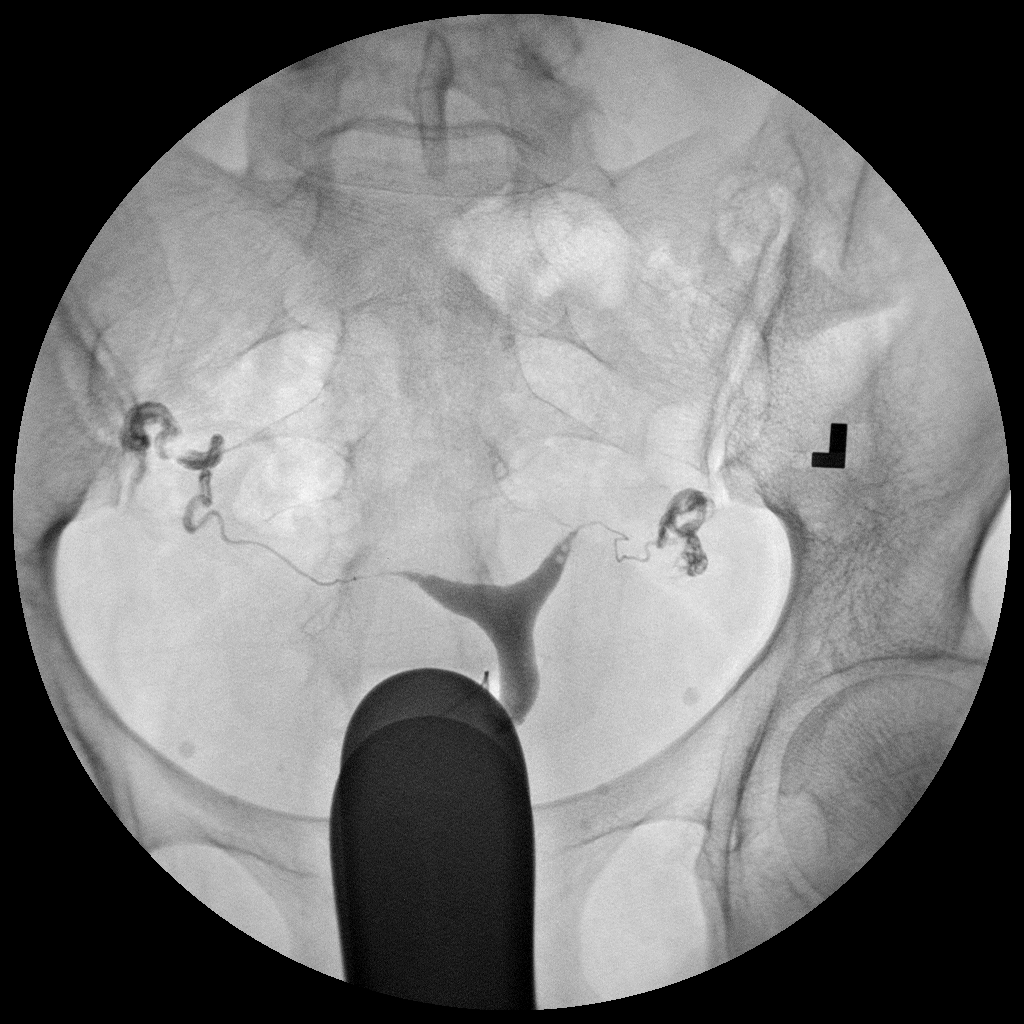

[Series 3: run · 1 of 1 slices shown (3 of 3)]
[im 1/1]
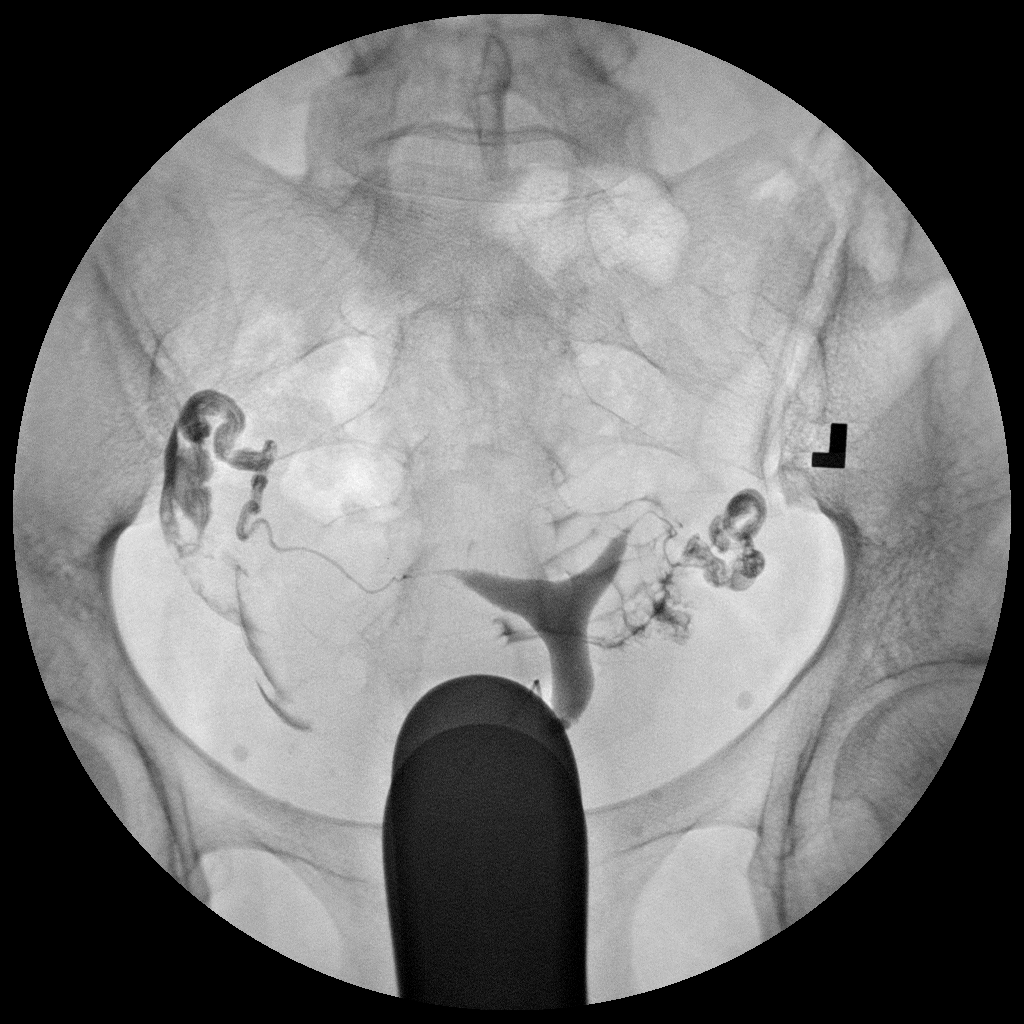

[3 of 3 positions shown; findings below may reference images not displayed]

FLUOROSCOPY TIME:  Radiation Exposure Index (as provided by the
fluoroscopic device):

If the device does not provide the exposure index:

Fluoroscopy Time:  30 seconds

Number of Acquired Images:  3
FINDINGS: Endometrial cavity is normal. Both fallopian tubes fill and have a
normal appearance. Normal spillage bilaterally.
IMPRESSION: Normal study.
